# Patient Record
Sex: Male | Born: 2000 | Race: White | Hispanic: Yes | Marital: Single | Smoking: Never smoker
Health system: Southern US, Community
[De-identification: ages and names within clinical notes are randomized; demographics above are authoritative.]

## PROBLEM LIST (undated history)

## (undated) DIAGNOSIS — I10 Essential (primary) hypertension: Secondary | ICD-10-CM

---
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
## 2019-11-03 ENCOUNTER — Other Ambulatory Visit: Payer: Self-pay

## 2019-11-03 ENCOUNTER — Ambulatory Visit (HOSPITAL_COMMUNITY)
Admission: EM | Admit: 2019-11-03 | Discharge: 2019-11-03 | Disposition: A | Discharge status: Home / Self Care | Payer: Medicaid Other | Attending: Emergency Medicine | Admitting: Emergency Medicine

## 2019-11-03 ENCOUNTER — Encounter (HOSPITAL_COMMUNITY): Payer: Self-pay | Admitting: Emergency Medicine

## 2019-11-03 DIAGNOSIS — J069 Acute upper respiratory infection, unspecified: Secondary | ICD-10-CM | POA: Diagnosis present

## 2019-11-03 DIAGNOSIS — U071 COVID-19: Secondary | ICD-10-CM | POA: Diagnosis not present

## 2019-11-03 DIAGNOSIS — I10 Essential (primary) hypertension: Secondary | ICD-10-CM | POA: Diagnosis not present

## 2019-11-03 DIAGNOSIS — Z79899 Other long term (current) drug therapy: Secondary | ICD-10-CM | POA: Insufficient documentation

## 2019-11-03 HISTORY — DX: Essential (primary) hypertension: I10

## 2019-11-03 LAB — SARS CORONAVIRUS 2 (TAT 6-24 HRS): SARS Coronavirus 2: POSITIVE — AB

## 2019-11-03 MED ORDER — FLUTICASONE PROPIONATE 50 MCG/ACT NA SUSP
1.0000 | Freq: Every day | NASAL | 0 refills | Status: AC
Start: 2019-11-03 — End: 2019-11-10

## 2019-11-03 MED ORDER — IBUPROFEN 800 MG PO TABS: 800.0000 mg | ORAL_TABLET | Freq: Three times a day (TID) | ORAL | 0 refills | Status: AC

## 2019-11-03 MED ORDER — BENZONATATE 200 MG PO CAPS: 200.0000 mg | ORAL_CAPSULE | Freq: Three times a day (TID) | ORAL | 0 refills | Status: AC | PRN

## 2019-11-03 NOTE — ED Triage Notes (Addendum)
Patient presents to [facility] for COVID test.  States for 2 days he has been having headache and chest pain starting 2 days ago.  C/o mild nasal congestion and cough over the weekend.  C/o muscle pains.  Patient c/o feeling like he got more winded yesterday when he was up trying to do things.  Patient also states he has been forgetting to take his prescribed lisinopril, states he has only taken it a few times over the past few months.  Patient c/o intermittent dizziness with light-headedness, specifically with movement.

## 2019-11-03 NOTE — Discharge Instructions (Signed)
COVID test pending Tylneol and ibuprofen for pain, bodyaches, headache, fever Rest and fluids Tessalon/benzonatate for cough Flonase for congestion May use other over the counter medicine as alternative Follow up for any concerns

## 2019-11-03 NOTE — ED Provider Notes (Signed)
[facility]    CSN: [id number] Arrival date & time: 11/03/19  0909      History   Chief Complaint Chief Complaint  Patient presents with   Chest Pain   Headache    HPI Abdon Petrosky is a 19 y.o. male history of hypertension, tobacco use, presenting today for evaluation of URI symptoms.  Patient reports he developed cough and congestion over the weekend.  He developed body aches as well as chest pain yesterday.  He has had some occasional episodes of dizziness and lightheadedness associated with movement.  Denies any close sick contacts.  Reports low-grade fevers.   Denies leg pain or leg swelling.  Denies prior DVT/PE.  Denies history of immobilization.     HPI  Past Medical History:  Diagnosis Date   Hypertension     There are no problems to display for this patient.   History reviewed. No pertinent surgical history.     Home Medications    Prior to Admission medications   Medication Sig Start Date End Date Taking? Authorizing Provider  acetaminophen (TYLENOL) 500 MG tablet Take 500 mg by mouth every 6 (six) hours as needed. Reported on 08/08/2015    [provider]  benzonatate (TESSALON) 200 MG capsule Take 1 capsule (200 mg total) by mouth 3 (three) times daily as needed for up to 7 days for cough. 11/03/19 11/10/19  Lashauna Arpin C, PA-C  fluticasone (FLONASE) 50 MCG/ACT nasal spray Place 1-2 sprays into both nostrils daily for 7 days. 11/03/19 11/10/19  Danial Sisley C, PA-C  ibuprofen (ADVIL) 800 MG tablet Take 1 tablet (800 mg total) by mouth 3 (three) times daily. 11/03/19   Adger Cantera C, PA-C  lisinopril (ZESTRIL) 20 MG tablet Take 20 mg by mouth daily.    [provider]    Family History Family History  Problem Relation Age of Onset   Hypertension Mother    Hypertension Maternal Grandmother     Social History Social History   Tobacco Use   Smoking status: Never Smoker   Smokeless tobacco: Never Used    Substance Use Topics   Alcohol use: No   Drug use: Not on file     Allergies   Patient has no known allergies.   Review of Systems Review of Systems  Constitutional: Positive for chills, fatigue and fever. Negative for activity change and appetite change.  HENT: Positive for congestion, rhinorrhea and sinus pressure. Negative for ear pain, sore throat and trouble swallowing.   Eyes: Negative for discharge and redness.  Respiratory: Positive for cough. Negative for chest tightness and shortness of breath.   Cardiovascular: Positive for chest pain. Negative for leg swelling.  Gastrointestinal: Negative for abdominal pain, diarrhea, nausea and vomiting.  Musculoskeletal: Positive for myalgias.  Skin: Negative for rash.  Neurological: Positive for headaches. Negative for dizziness and light-headedness.     Physical Exam Triage Vital Signs ED Triage Vitals [11/03/19 1036]  Enc Vitals Group     BP 136/83     Pulse Rate (!) 57     Resp 18     Temp 98.5 F (36.9 C)     Temp Source Oral     SpO2 100 %     Weight      Height      Head Circumference      Peak Flow      Pain Score 8     Pain Loc      Pain Edu?  Excl. in GC?    No data found.  Updated Vital Signs BP 136/83 (BP Location: Right Arm)    Pulse (!) 57    Temp 98.5 F (36.9 C) (Oral)    Resp 18    SpO2 100%   Visual Acuity Right Eye Distance:   Left Eye Distance:   Bilateral Distance:    Right Eye Near:   Left Eye Near:    Bilateral Near:     Physical Exam Vitals and nursing note reviewed.  Constitutional:      Appearance: He is well-developed.     Comments: No acute distress  HENT:     Head: Normocephalic and atraumatic.     Ears:     Comments: Bilateral ears without tenderness to palpation of external auricle, tragus and mastoid, EAC's without erythema or swelling, TM's with good bony landmarks and cone of light. Non erythematous.      Nose: Nose normal.     Mouth/Throat:     Comments:  Oral mucosa pink and moist, no tonsillar enlargement or exudate. Posterior pharynx patent and nonerythematous, no uvula deviation or swelling. Normal phonation. Eyes:     Conjunctiva/sclera: Conjunctivae normal.  Cardiovascular:     Rate and Rhythm: Normal rate.  Pulmonary:     Effort: Pulmonary effort is normal. No respiratory distress.     Comments: Breathing comfortably at rest, CTABL, no wheezing, rales or other adventitious sounds auscultated  Chest nontender to palpation anteriorly Abdominal:     General: There is no distension.  Musculoskeletal:        General: Normal range of motion.     Cervical back: Neck supple.  Skin:    General: Skin is warm and dry.  Neurological:     Mental Status: He is alert and oriented to person, place, and time.      UC Treatments / Results  Labs (all labs ordered are listed, but only abnormal results are displayed) Labs Reviewed  SARS CORONAVIRUS 2 (TAT 6-24 HRS)    EKG   Radiology No results found.  Procedures Procedures (including critical care time)  Medications Ordered in UC Medications - No data to display  Initial Impression / Assessment and Plan / UC Course  I have reviewed the triage vital signs and the nursing notes.  Pertinent labs & imaging results that were available during my care of the patient were reviewed by me and considered in my medical decision making (see chart for details).     Covid test pending, 4 days of URI symptoms and body aches.  EKG normal sinus rhythm, no acute signs of ischemia or infarction.  Low suspicion of DVT/PE at this time.  Recommending symptomatic and supportive care with close monitoring.  Rest and fluids.  Discussed strict return precautions. Patient verbalized understanding and is agreeable with plan.  Final Clinical Impressions(s) / UC Diagnoses   Final diagnoses:  Viral URI with cough     Discharge Instructions     COVID test pending Tylneol and ibuprofen for pain,  bodyaches, headache, fever Rest and fluids Tessalon/benzonatate for cough Flonase for congestion May use other over the counter medicine as alternative Follow up for any concerns    ED Prescriptions    Medication Sig Dispense Auth. Provider   benzonatate (TESSALON) 200 MG capsule Take 1 capsule (200 mg total) by mouth 3 (three) times daily as needed for up to 7 days for cough. 28 capsule Johanna Stafford C, PA-C   ibuprofen (ADVIL) 800 MG tablet  Take 1 tablet (800 mg total) by mouth 3 (three) times daily. 21 tablet Aviel Davalos C, PA-C   fluticasone (FLONASE) 50 MCG/ACT nasal spray Place 1-2 sprays into both nostrils daily for 7 days. 1 g Rollo Farquhar, [place] C, PA-C     PDMP not reviewed this encounter.   Lew Dawes, PA-C 11/03/19 1128

## 2019-11-04 ENCOUNTER — Other Ambulatory Visit: Payer: Self-pay | Admitting: Family

## 2019-11-04 ENCOUNTER — Telehealth: Payer: Self-pay | Admitting: Family

## 2019-11-04 ENCOUNTER — Ambulatory Visit (HOSPITAL_COMMUNITY): Payer: Medicaid Other

## 2019-11-04 DIAGNOSIS — Z720 Tobacco use: Secondary | ICD-10-CM

## 2019-11-04 DIAGNOSIS — E663 Overweight: Secondary | ICD-10-CM

## 2019-11-04 DIAGNOSIS — U071 COVID-19: Secondary | ICD-10-CM

## 2019-11-04 DIAGNOSIS — I10 Essential (primary) hypertension: Secondary | ICD-10-CM

## 2019-11-04 NOTE — Telephone Encounter (Signed)
Called to Discuss with patient about Covid symptoms and the use of the monoclonal antibody infusion for those with mild to moderate Covid symptoms and at a high risk of hospitalization.     Pt appears to qualify for this infusion due to co-morbid conditions and/or a member of an at-risk group in accordance with the FDA Emergency Use Authorization.   Mr. Pustejovsky was seen in the [facility] on 9/14 with URI symptoms of cough, congestion and body aches with occasional episodes of dizziness and lightheadedness.  Symptom onset was 10/31/2019.  COVID test was positive.  Qualifying risk factors include hypertension, BMI greater than 25, tobacco use, and social inequity.  Spoke with Mr. Goynes regarding the risks and benefits of Regeneron and he wishes to proceed with therapy.  Hello Devonte Migues,   You have been scheduled to receive Regeneron (the monoclonal antibody we discussed) on : 11/05/19 at 12:30 pm   If you have been tested outside of a [facility] - you MUST bring a copy of your positive test with you the morning of your appointment. You may take a photo of this and upload to your MyChart portal or have the testing facility fax the result to [phone]    The address for the infusion clinic site is:  --GPS address is [address] - the parking is located near [employer] building where you will see  COVID19 Infusion feather banner marking the entrance to parking.   (see photos below)            --Enter into the 2nd entrance where the "wave, flag banner" is at the road. Turn into this 2nd entrance and immediately turn left to park in 1 of the 5 parking spots.   --Please stay in your car and call the desk for assistance inside [phone].   --Average time in department is roughly 2 hours for Regeneron treatment - this includes preparation of the medication, IV start and the required 1 hour monitoring after the infusion.    Should you develop worsening  shortness of breath, chest pain or severe breathing problems please do not wait for this appointment and go to the Emergency room for evaluation and treatment. You will undergo another oxygen screen before your infusion to ensure this is the best treatment option for you. There is a chance that the best decision may be to send you to the Emergency Room for evaluation at the time of your appointment.   The day of your visit you should: Marland Kitchen Get plenty of rest the night before and drink plenty of water . Eat a light meal/snack before coming and take your medications as prescribed  . Wear warm, comfortable clothes with a shirt that can roll-up over the elbow (will need IV start).  . Wear a mask  . Consider bringing some activity to help pass the time  Many commercial insurers are waiving bills related to COVID treatment however some have ranged from $300-640. We are starting to see some insurers send bills to patients later for the administration of the medication - we are learning more information but you may receive a bill after your appointment.  Please contact your insurance agent to discuss prior to your appointment if you would like further details about billing specific to your policy.    I hope this helps find you feeling better,   Marcos Eke, NP 11/04/2019  9:34 AM

## 2019-11-04 NOTE — Progress Notes (Signed)
I connected by phone with Eddie Hill on 11/04/2019 at 9:35 AM to discuss the potential use of a new treatment for mild to moderate COVID-19 viral infection in non-hospitalized patients.  This patient is a 19 y.o. male that meets the FDA criteria for Emergency Use Authorization of COVID monoclonal antibody casirivimab/imdevimab.  Has a (+) direct SARS-CoV-2 viral test result  Has mild or moderate COVID-19   Is NOT hospitalized due to COVID-19  Is within 10 days of symptom onset  Has at least one of the high risk factor(s) for progression to severe COVID-19 and/or hospitalization as defined in EUA.  Specific high risk criteria : BMI > 25, Cardiovascular disease or hypertension and Other high risk medical condition per CDC:  Social vulunerability and tobacco use   I have spoken and communicated the following to the patient or parent/caregiver regarding COVID monoclonal antibody treatment:  1. FDA has authorized the emergency use for the treatment of mild to moderate COVID-19 in adults and pediatric patients with positive results of direct SARS-CoV-2 viral testing who are 30 years of age and older weighing at least 40 kg, and who are at high risk for progressing to severe COVID-19 and/or hospitalization.  2. The significant known and potential risks and benefits of COVID monoclonal antibody, and the extent to which such potential risks and benefits are unknown.  3. Information on available alternative treatments and the risks and benefits of those alternatives, including clinical trials.  4. Patients treated with COVID monoclonal antibody should continue to self-isolate and use infection control measures (e.g., wear mask, isolate, social distance, avoid sharing personal items, clean and disinfect "high touch" surfaces, and frequent handwashing) according to CDC guidelines.   5. The patient or parent/caregiver has the option to accept or refuse COVID monoclonal antibody treatment.  After  reviewing this information with the patient, The patient agreed to proceed with receiving casirivimab\imdevimab infusion and will be provided a copy of the Fact sheet prior to receiving the infusion.   Jeanine Luz, NP 11/04/2019 9:35 AM

## 2019-11-05 ENCOUNTER — Ambulatory Visit (HOSPITAL_COMMUNITY): Payer: Medicaid Other

## 2020-03-24 ENCOUNTER — Ambulatory Visit (INDEPENDENT_AMBULATORY_CARE_PROVIDER_SITE_OTHER): Payer: Medicaid Other

## 2020-03-24 ENCOUNTER — Ambulatory Visit (HOSPITAL_COMMUNITY)
Admission: EM | Admit: 2020-03-24 | Discharge: 2020-03-24 | Disposition: A | Discharge status: Home / Self Care | Payer: Medicaid Other | Attending: Student | Admitting: Student

## 2020-03-24 ENCOUNTER — Other Ambulatory Visit: Payer: Self-pay

## 2020-03-24 DIAGNOSIS — S61412A Laceration without foreign body of left hand, initial encounter: Secondary | ICD-10-CM

## 2020-03-24 DIAGNOSIS — S61032A Puncture wound without foreign body of left thumb without damage to nail, initial encounter: Secondary | ICD-10-CM

## 2020-03-24 MED ORDER — TETANUS-DIPHTH-ACELL PERTUSSIS 5-2.5-18.5 LF-MCG/0.5 IM SUSY
0.5000 mL | PREFILLED_SYRINGE | Freq: Once | INTRAMUSCULAR | Status: AC
  Administered 2020-03-24: 0.5 mL via INTRAMUSCULAR

## 2020-03-24 MED ORDER — TETANUS-DIPHTH-ACELL PERTUSSIS 5-2.5-18.5 LF-MCG/0.5 IM SUSY
PREFILLED_SYRINGE | INTRAMUSCULAR | Status: AC
  Filled 2020-03-24: qty 0.5

## 2020-03-24 MED ORDER — AMOXICILLIN-POT CLAVULANATE 875-125 MG PO TABS: 1.0000 | ORAL_TABLET | Freq: Two times a day (BID) | ORAL | 0 refills | Status: AC

## 2020-03-24 NOTE — ED Provider Notes (Signed)
[facility]    CSN: [id number] Arrival date & time: 03/24/20  1728      History   Chief Complaint Chief Complaint  Patient presents with  . Laceration    HPI Eddie Hill is a 20 y.o. male presenting with laceration left hand. History hypertension.  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  States the screwdriver was caught in his hand and he had to pull it out.  Since then he has had pain and swelling around the area.  Also endorses numbness and tingling around the area. States pain is worse midpalm.  Able to move fingers but with pain.  Screwdriver was rusty and he has not had a tetanus shot in years.  He is right-handed  HPI  Past Medical History:  Diagnosis Date  . Hypertension     There are no problems to display for this patient.   No past surgical history on file.     Home Medications    Prior to Admission medications   Medication Sig Start Date End Date Taking? Authorizing Provider  amoxicillin-clavulanate (AUGMENTIN) 875-125 MG tablet Take 1 tablet by mouth every 12 (twelve) hours. 03/24/20  Yes Rhys Martini, PA-C  acetaminophen (TYLENOL) 500 MG tablet Take 500 mg by mouth every 6 (six) hours as needed. Reported on 08/08/2015    [provider]  fluticasone (FLONASE) 50 MCG/ACT nasal spray Place 1-2 sprays into both nostrils daily for 7 days. 11/03/19 11/10/19  Wieters, Hallie C, PA-C  ibuprofen (ADVIL) 800 MG tablet Take 1 tablet (800 mg total) by mouth 3 (three) times daily. 11/03/19   Wieters, Hallie C, PA-C  lisinopril (ZESTRIL) 20 MG tablet Take 20 mg by mouth daily.    [provider]    Family History Family History  Problem Relation Age of Onset  . Hypertension Mother   . Hypertension Maternal Grandmother     Social History Social History   Tobacco Use  . Smoking status: Never Smoker  . Smokeless  tobacco: Never Used  Substance Use Topics  . Alcohol use: No     Allergies   Patient has no known allergies.   Review of Systems Review of Systems  Musculoskeletal:       Left hand injury  All other systems reviewed and are negative.    Physical Exam Triage Vital Signs ED Triage Vitals  Enc Vitals Group     BP 03/24/20 1809 (!) 161/89     Pulse Rate 03/24/20 1809 77     Resp 03/24/20 1809 17     Temp 03/24/20 1809 99.2 F (37.3 C)     Temp Source 03/24/20 1809 Oral     SpO2 03/24/20 1809 99 %     Weight --      Height --      Head Circumference --      Peak Flow --      Pain Score 03/24/20 1807 9     Pain Loc --      Pain Edu? --      Excl. in GC? --    No data found.  Updated Vital Signs BP (!) 161/89 (BP Location: Right Arm)   Pulse 77   Temp 99.2 F (37.3 C) (Oral)   Resp 17   SpO2 99%   Visual Acuity Right Eye Distance:  Left Eye Distance:   Bilateral Distance:    Right Eye Near:   Left Eye Near:    Bilateral Near:     Physical Exam Vitals reviewed.  Constitutional:      Appearance: Normal appearance.  Cardiovascular:     Rate and Rhythm: Normal rate and regular rhythm.     Heart sounds: Normal heart sounds.  Pulmonary:     Effort: Pulmonary effort is normal.     Breath sounds: Normal breath sounds.  Musculoskeletal:     Comments: 38mm laceration base of medial thumb. Not actively bleeding. Tenderness and swelling surrounding wound. Significant tenderness with palpation over central volar palm. ROM fingers, thumb intact but with pain. No ecchymosis or deformity. Sensation intact but with numbness and tingling surrounding wound. Cap refill <2 seconds.   Neurological:     General: No focal deficit present.     Mental Status: He is alert and oriented to person, place, and time.  Psychiatric:        Mood and Affect: Mood normal.        Behavior: Behavior normal.        Thought Content: Thought content normal.        Judgment: Judgment  normal.      UC Treatments / Results  Labs (all labs ordered are listed, but only abnormal results are displayed) Labs Reviewed - No data to display  EKG   Radiology DG Hand Complete Left  Result Date: 03/24/2020 CLINICAL DATA:  Puncture wound at the base of the left thumb, initial encounter EXAM: LEFT HAND - COMPLETE 3+ VIEW COMPARISON:  None. FINDINGS: There is no evidence of fracture or dislocation. There is no evidence of arthropathy or other focal bone abnormality. Soft tissues are unremarkable. IMPRESSION: No acute abnormality noted. Electronically Signed   By: Alcide Clever M.D.   On: 03/24/2020 19:22    Procedures Procedures (including critical care time)  Medications Ordered in UC Medications  Tdap (BOOSTRIX) injection 0.5 mL (0.5 mLs Intramuscular Given 03/24/20 2009)    Initial Impression / Assessment and Plan / UC Course  I have reviewed the triage vital signs and the nursing notes.  Pertinent labs & imaging results that were available during my care of the patient were reviewed by me and considered in my medical decision making (see chart for details).     Xray L hand negative.   Please call tomorrow to schedule follow-up appointment with hand specialist.  Please follow-up with them in the next 2 to 3 days.  Tdap not up-to-date; administered today  Spent over 40 minutes obtaining H&P, performing physical,interpreting x-ray  discussing results, treatment plan and plan for follow-up with patient. Patient agrees with plan.    Final Clinical Impressions(s) / UC Diagnoses   Final diagnoses:  Laceration of left hand, foreign body presence unspecified, initial encounter     Discharge Instructions     -Make an appointment with hand specialist (information below) for further evaluation  -Start the antibiotic Augmentin take this twice daily for 7 days.  This is to prevent infection -Wound care: 1. Keep the wound clean and dry for the next 12 hours. Leave your  bandage on for this time.  2. After 12 hours, clean the wound once a day. ? Wash the wound with soap and water. ? Rinse the wound with water to remove all soap. ? Pat the wound dry with a clean towel. Do not rub the wound. 3. After you clean the wound, put  a thin layer of antibiotic ointment on it as told by your doctor. This ointment: ? Helps to prevent infection. ? Keeps the bandage from sticking to the wound. ? Put a bandaid on top  4. Come back and see [place] if the wound seems to be getting infected     ED Prescriptions    Medication Sig Dispense Auth. Provider   amoxicillin-clavulanate (AUGMENTIN) 875-125 MG tablet Take 1 tablet by mouth every 12 (twelve) hours. 14 tablet Rhys Martini, PA-C     PDMP not reviewed this encounter.   Rhys Martini, PA-C 03/24/20 2024

## 2020-03-24 NOTE — Discharge Instructions (Addendum)
-  Make an appointment with hand specialist (information below) for further evaluation  -Start the antibiotic Augmentin take this twice daily for 7 days.  This is to prevent infection -Wound care: Keep the wound clean and dry for the next 12 hours. Leave your bandage on for this time.  After 12 hours, clean the wound once a day. Wash the wound with soap and water. Rinse the wound with water to remove all soap. Pat the wound dry with a clean towel. Do not rub the wound. After you clean the wound, put a thin layer of antibiotic ointment on it as told by your doctor. This ointment: Helps to prevent infection. Keeps the bandage from sticking to the wound. Put a bandaid on top  Come back and see [place] if the wound seems to be getting infected

## 2020-03-24 NOTE — ED Triage Notes (Signed)
Pt presents with laceration on hand after stabbing self with screw driver this am at work.

## 2022-09-04 IMAGING — DX DG HAND COMPLETE 3+V*L*
3 series · 3 of 3 positions shown · non-contrast
Comparison: None.

CLINICAL DATA: Puncture wound at the base of the left thumb,
initial encounter

EXAM:
LEFT HAND - COMPLETE 3+ VIEW

[hand pa]
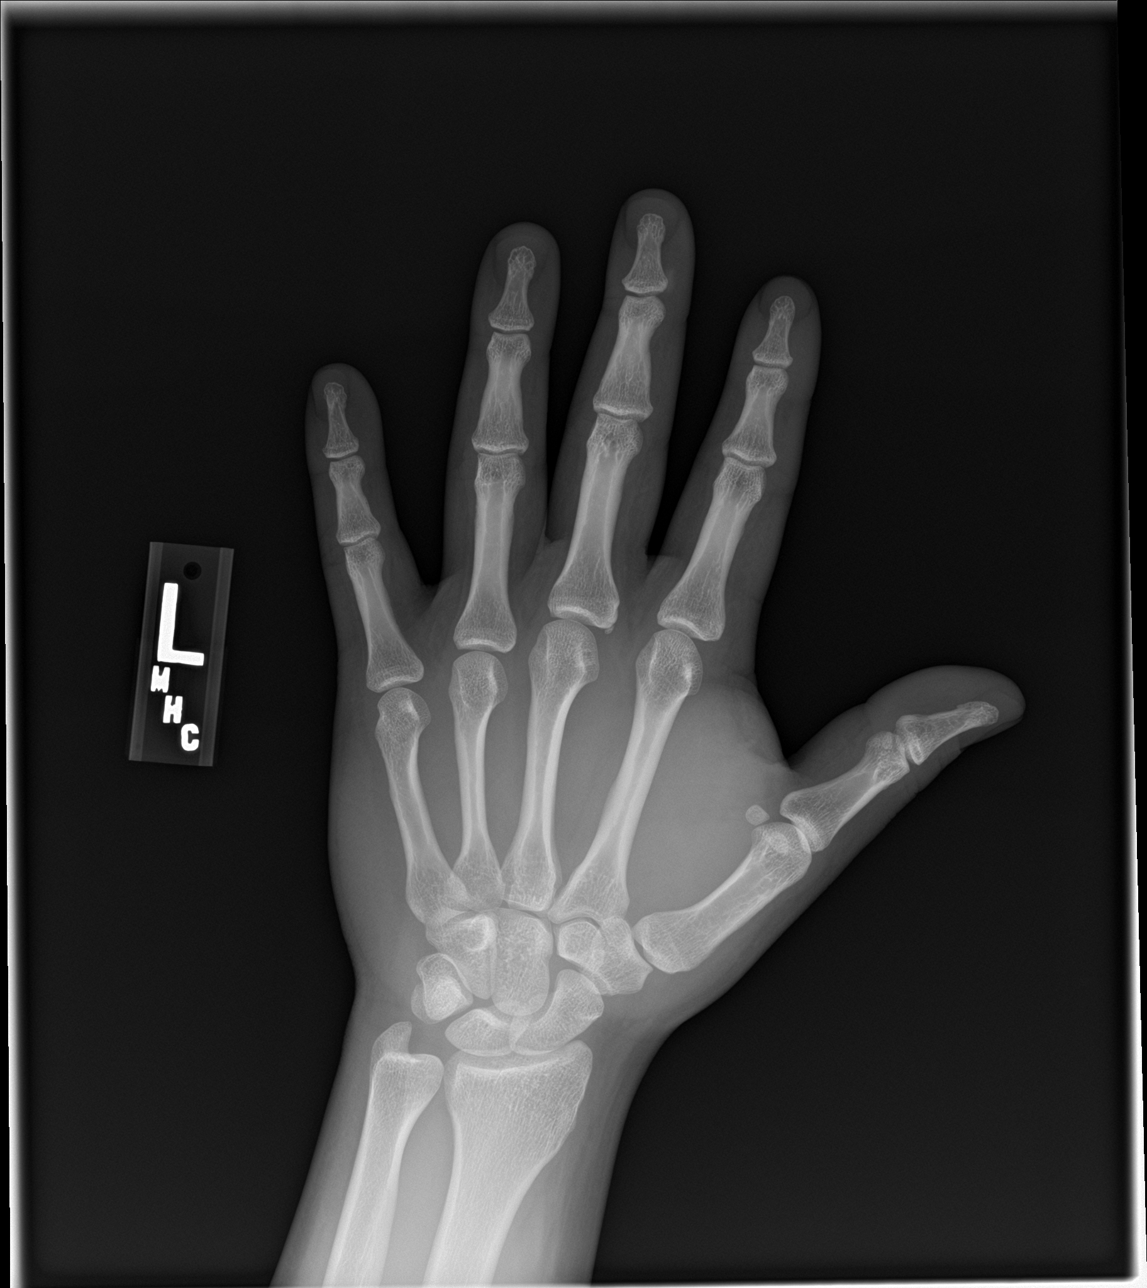

[hand obl]
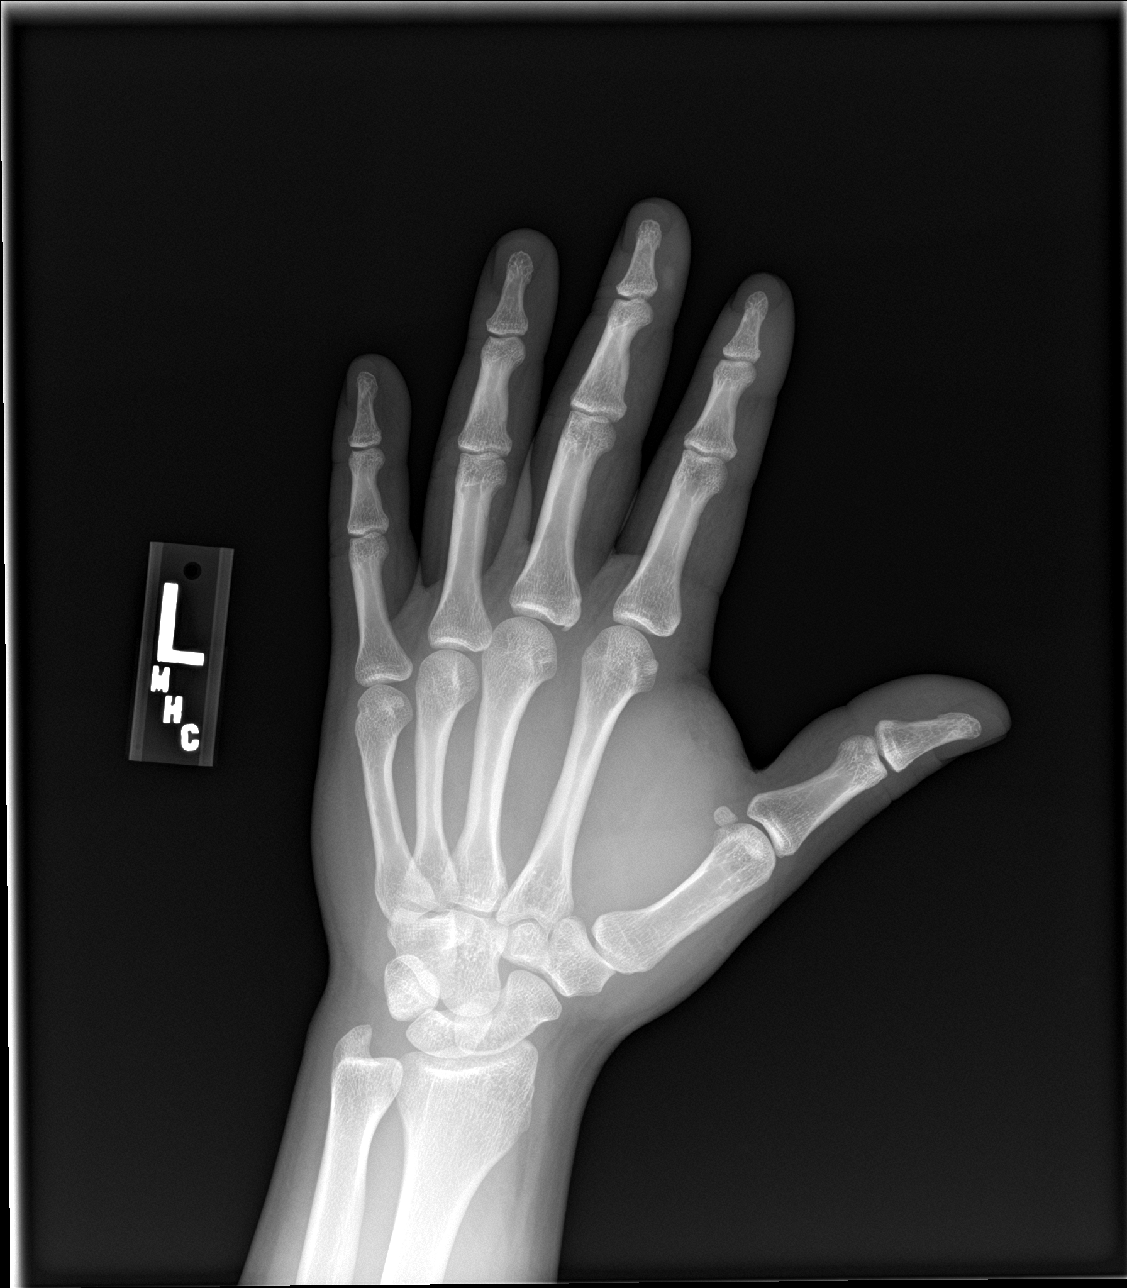

[hand lat]
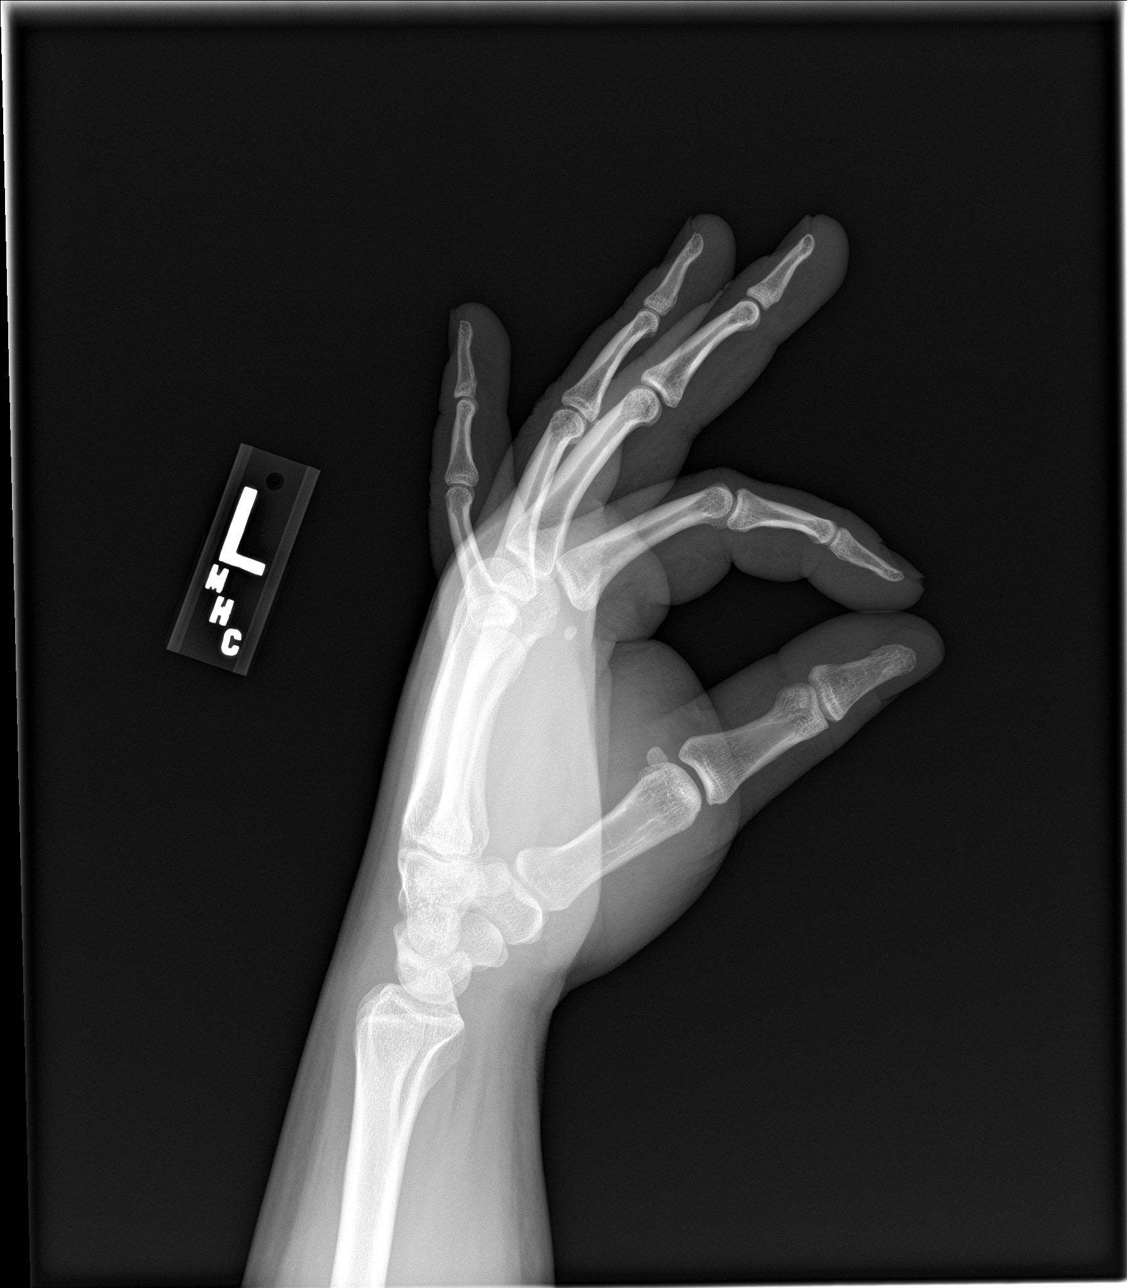

[3 of 3 positions shown; findings below may reference images not displayed]

FINDINGS: There is no evidence of fracture or dislocation. There is no
evidence of arthropathy or other focal bone abnormality. Soft
tissues are unremarkable.
IMPRESSION: No acute abnormality noted.
